# Patient Record
Sex: Male | Born: 1940 | Hispanic: No | Marital: Married | Smoking: Never smoker
Health system: Southern US, Community
[De-identification: ages and names within clinical notes are randomized; demographics above are authoritative.]

## PROBLEM LIST (undated history)

## (undated) DIAGNOSIS — C189 Malignant neoplasm of colon, unspecified: Secondary | ICD-10-CM

## (undated) HISTORY — PX: CHOLECYSTECTOMY: SHX55

## (undated) HISTORY — PX: COLON RESECTION: SHX5231

---

## 2017-05-18 ENCOUNTER — Emergency Department (HOSPITAL_BASED_OUTPATIENT_CLINIC_OR_DEPARTMENT_OTHER)
Admission: EM | Admit: 2017-05-18 | Discharge: 2017-05-18 | Disposition: A | Discharge status: Home / Self Care | Payer: Self-pay | Attending: Emergency Medicine | Admitting: Emergency Medicine

## 2017-05-18 ENCOUNTER — Emergency Department (HOSPITAL_BASED_OUTPATIENT_CLINIC_OR_DEPARTMENT_OTHER): Payer: Self-pay

## 2017-05-18 ENCOUNTER — Encounter (HOSPITAL_BASED_OUTPATIENT_CLINIC_OR_DEPARTMENT_OTHER): Payer: Self-pay | Admitting: *Deleted

## 2017-05-18 DIAGNOSIS — Z85038 Personal history of other malignant neoplasm of large intestine: Secondary | ICD-10-CM | POA: Insufficient documentation

## 2017-05-18 DIAGNOSIS — J189 Pneumonia, unspecified organism: Secondary | ICD-10-CM

## 2017-05-18 DIAGNOSIS — J181 Lobar pneumonia, unspecified organism: Secondary | ICD-10-CM | POA: Insufficient documentation

## 2017-05-18 DIAGNOSIS — N179 Acute kidney failure, unspecified: Secondary | ICD-10-CM | POA: Insufficient documentation

## 2017-05-18 DIAGNOSIS — R05 Cough: Secondary | ICD-10-CM

## 2017-05-18 DIAGNOSIS — R059 Cough, unspecified: Secondary | ICD-10-CM

## 2017-05-18 HISTORY — DX: Malignant neoplasm of colon, unspecified: C18.9

## 2017-05-18 LAB — COMPREHENSIVE METABOLIC PANEL
ALT: 51 U/L (ref 17–63)
AST: 56 U/L — AB (ref 15–41)
Albumin: 3.9 g/dL (ref 3.5–5.0)
Alkaline Phosphatase: 161 U/L — ABNORMAL HIGH (ref 38–126)
Anion gap: 13 (ref 5–15)
BUN: 53 mg/dL — ABNORMAL HIGH (ref 6–20)
CHLORIDE: 103 mmol/L (ref 101–111)
CO2: 13 mmol/L — AB (ref 22–32)
CREATININE: 1.85 mg/dL — AB (ref 0.61–1.24)
Calcium: 9 mg/dL (ref 8.9–10.3)
GFR calc non Af Amer: 34 mL/min — ABNORMAL LOW (ref >60)
GFR, EST AFRICAN AMERICAN: 39 mL/min — AB (ref >60)
Glucose, Bld: 111 mg/dL — ABNORMAL HIGH (ref 65–99)
POTASSIUM: 4.9 mmol/L (ref 3.5–5.1)
SODIUM: 129 mmol/L — AB (ref 135–145)
Total Bilirubin: 0.6 mg/dL (ref 0.3–1.2)
Total Protein: 8.7 g/dL — ABNORMAL HIGH (ref 6.5–8.1)

## 2017-05-18 LAB — CBC WITH DIFFERENTIAL/PLATELET
BASOS ABS: 0 10*3/uL (ref 0.0–0.1)
Basophils Relative: 0 %
EOS ABS: 0 10*3/uL (ref 0.0–0.7)
EOS PCT: 0 %
HCT: 45.5 % (ref 39.0–52.0)
Hemoglobin: 16 g/dL (ref 13.0–17.0)
Lymphocytes Relative: 16 %
Lymphs Abs: 1.5 10*3/uL (ref 0.7–4.0)
MCH: 33.4 pg (ref 26.0–34.0)
MCHC: 35.2 g/dL (ref 30.0–36.0)
MCV: 95 fL (ref 78.0–100.0)
Monocytes Absolute: 1 10*3/uL (ref 0.1–1.0)
Monocytes Relative: 12 %
Neutro Abs: 6.5 10*3/uL (ref 1.7–7.7)
Neutrophils Relative %: 72 %
PLATELETS: 193 10*3/uL (ref 150–400)
RBC: 4.79 MIL/uL (ref 4.22–5.81)
RDW: 13.7 % (ref 11.5–15.5)
WBC: 9.1 10*3/uL (ref 4.0–10.5)

## 2017-05-18 MED ORDER — SODIUM CHLORIDE 0.9 % IV BOLUS (SEPSIS)
1000.0000 mL | Freq: Once | INTRAVENOUS | Status: AC
  Administered 2017-05-18: 1000 mL via INTRAVENOUS

## 2017-05-18 MED ORDER — DOXYCYCLINE HYCLATE 100 MG PO TABS
100.0000 mg | ORAL_TABLET | Freq: Once | ORAL | Status: AC
  Administered 2017-05-18: 100 mg via ORAL
  Filled 2017-05-18: qty 1

## 2017-05-18 MED ORDER — DOXYCYCLINE HYCLATE 100 MG PO CAPS: 100.0000 mg | ORAL_CAPSULE | Freq: Two times a day (BID) | ORAL | 0 refills | Status: AC

## 2017-05-18 NOTE — ED Notes (Signed)
Pt requested more cheese, crackers, and juice. Given to patient.

## 2017-05-18 NOTE — ED Provider Notes (Signed)
[facility] EMERGENCY DEPARTMENT Provider Note  CSN: [id number] Arrival date & time: 05/18/17 1400  Chief Complaint(s) URI  HPI Thomas Walker is a 76 y.o. male   The history is provided by the patient.  Influenza  Presenting symptoms: cough, diarrhea, fatigue, myalgias, rhinorrhea and sore throat   Presenting symptoms: no vomiting   Severity:  Moderate Onset quality:  Gradual Duration:  6 days Progression:  Waxing and waning Chronicity:  New Relieved by:  Nothing Worsened by:  Nothing Associated symptoms: chills, decreased appetite, decreased physical activity and nasal congestion   Risk factors: being elderly   Risk factors: no sick contacts     Past Medical History Past Medical History:  Diagnosis Date  . Colon cancer ([place])    There are no active problems to display for this patient.  Home Medication(s) Prior to Admission medications   Medication Sig Start Date End Date Taking? Authorizing Provider  doxycycline (VIBRAMYCIN) 100 MG capsule Take 1 capsule (100 mg total) by mouth 2 (two) times daily. 05/18/17 05/25/17  Fatima Blank, MD                                                                                                                                    Past Surgical History Past Surgical History:  Procedure Laterality Date  . CHOLECYSTECTOMY    . COLON RESECTION     Family History No family history on file.  Social History Social History  Substance Use Topics  . Smoking status: Never Smoker  . Smokeless tobacco: Not on file  . Alcohol use No   Allergies Patient has no known allergies.  Review of Systems Review of Systems  Constitutional: Positive for chills, decreased appetite and fatigue.  HENT: Positive for congestion, rhinorrhea and sore throat.   Respiratory: Positive for cough.   Gastrointestinal: Positive for diarrhea. Negative for vomiting.  Musculoskeletal: Positive for myalgias.   All other systems are  reviewed and are negative for acute change except as noted in the HPI  Physical Exam Vital Signs  I have reviewed the triage vital signs BP 111/62 (BP Location: Left Arm)   Pulse (!) 104   Temp 97.7 F (36.5 C) (Oral)   Resp 18   Ht 5\' 8"  (1.727 m)   Wt 62.6 kg (138 lb)   SpO2 99%   BMI 20.98 kg/m   Physical Exam  Constitutional: He is oriented to person, place, and time. He appears well-developed and well-nourished. He appears ill. No distress.  HENT:  Head: Normocephalic and atraumatic.  Nose: Nose normal.  Eyes: Pupils are equal, round, and reactive to light. Conjunctivae and EOM are normal. Right eye exhibits no discharge. Left eye exhibits no discharge. No scleral icterus.  Neck: Normal range of motion. Neck supple.  Cardiovascular: Normal rate and regular rhythm.  Exam reveals no gallop and no friction rub.   No murmur heard. Pulmonary/Chest: Effort normal. No  stridor. No respiratory distress. He has rales in the left lower field.  Abdominal: Soft. He exhibits no distension. There is no tenderness.  Musculoskeletal: He exhibits no edema or tenderness.  Neurological: He is alert and oriented to person, place, and time.  Skin: Skin is warm and dry. No rash noted. He is not diaphoretic. No erythema.  Psychiatric: He has a normal mood and affect.  Vitals reviewed.   ED Results and Treatments Labs (all labs ordered are listed, but only abnormal results are displayed) Labs Reviewed  COMPREHENSIVE METABOLIC PANEL - Abnormal; Notable for the following:       Result Value   Sodium 129 (*)    CO2 13 (*)    Glucose, Bld 111 (*)    BUN 53 (*)    Creatinine, Ser 1.85 (*)    Total Protein 8.7 (*)    AST 56 (*)    Alkaline Phosphatase 161 (*)    GFR calc non Af Amer 34 (*)    GFR calc Af Amer 39 (*)    All other components within normal limits  CBC WITH DIFFERENTIAL/PLATELET                                                                                                                          EKG  EKG Interpretation  Date/Time:    Ventricular Rate:    PR Interval:    QRS Duration:   QT Interval:    QTC Calculation:   R Axis:     Text Interpretation:        Radiology Dg Chest 2 View  Result Date: 05/18/2017 CLINICAL DATA:  URI symptoms for 6 days EXAM: CHEST  2 VIEW COMPARISON:  None. FINDINGS: Hyperinflation. Mild central bronchitic changes. Mild opacity overlying the cardiac silhouette on lateral view. Normal heart size. No pneumothorax. IMPRESSION: Small focus of consolidation at the anterior lung base may reflect atelectasis or a small infiltrate. Radiographic follow-up is recommended. Hyperinflation with minimal bronchitic changes. Electronically Signed   By: Donavan Foil M.D.   On: 05/18/2017 15:14   Pertinent labs & imaging results that were available during my care of the patient were reviewed by me and considered in my medical decision making (see chart for details).  Medications Ordered in ED Medications  sodium chloride 0.9 % bolus 1,000 mL (1,000 mLs Intravenous New Bag/Given 05/18/17 1457)  sodium chloride 0.9 % bolus 1,000 mL (1,000 mLs Intravenous New Bag/Given 05/18/17 1543)  doxycycline (VIBRA-TABS) tablet 100 mg (100 mg Oral Given 05/18/17 1543)  Procedures Procedures  (including critical care time)  Medical Decision Making / ED Course I have reviewed the nursing notes for this encounter and the patient's prior records (if available in EHR or on provided paperwork).    76 y.o. male presents with flu-like symptoms for 6 days. decreased oral hydration. Rest of history as above.  Patient appears well. No signs of toxicity, patient is interactive. No hypoxia, tachypnea or other signs of respiratory distress. No sign of clinical dehydration. Lung exam with LLL rales. Rest of exam as above.  CXR  concerning for PNA.   Labs without leukocytosis.  CMP with mild hyponatremia, also with evidence of acute renal insufficiency likely secondary to dehydration given the lack of PO intake.  Patient given IV fluid hydration and first dose of doxycycline in the emergency department.  No evidence suggestive of pharyngitis, AOM, or meningitis.  Offered admission however patient declined.  Will give patient additional IV fluid bolus and discharged home with strict return precautions.  Patient care turned over to Dr Sherry Ruffing at 1600. Patient case and results discussed in detail; please see their note for further ED managment.    Final Clinical Impression(s) / ED Diagnoses Final diagnoses:  Cough  Community acquired pneumonia of left lower lobe of lung ([place])  AKI (acute kidney injury) ([place])      This chart was dictated using voice recognition software.  Despite best efforts to proofread,  errors can occur which can change the documentation meaning.   Fatima Blank, MD 05/18/17 [phone]

## 2017-05-18 NOTE — ED Notes (Signed)
Pt discharged to home with family. NAD steady gait.

## 2017-05-18 NOTE — ED Notes (Signed)
Cheese, crackers and juice given to patient.

## 2017-05-18 NOTE — ED Triage Notes (Signed)
Pt c/o URi symptoms x 6 days , sent here from [facility] , flu test negative

## 2018-11-06 IMAGING — CR DG CHEST 2V
2 series · 2 of 2 positions shown · non-contrast
Comparison: None.

CLINICAL DATA: URI symptoms for 6 days

EXAM:
CHEST  2 VIEW

[w chest pa]
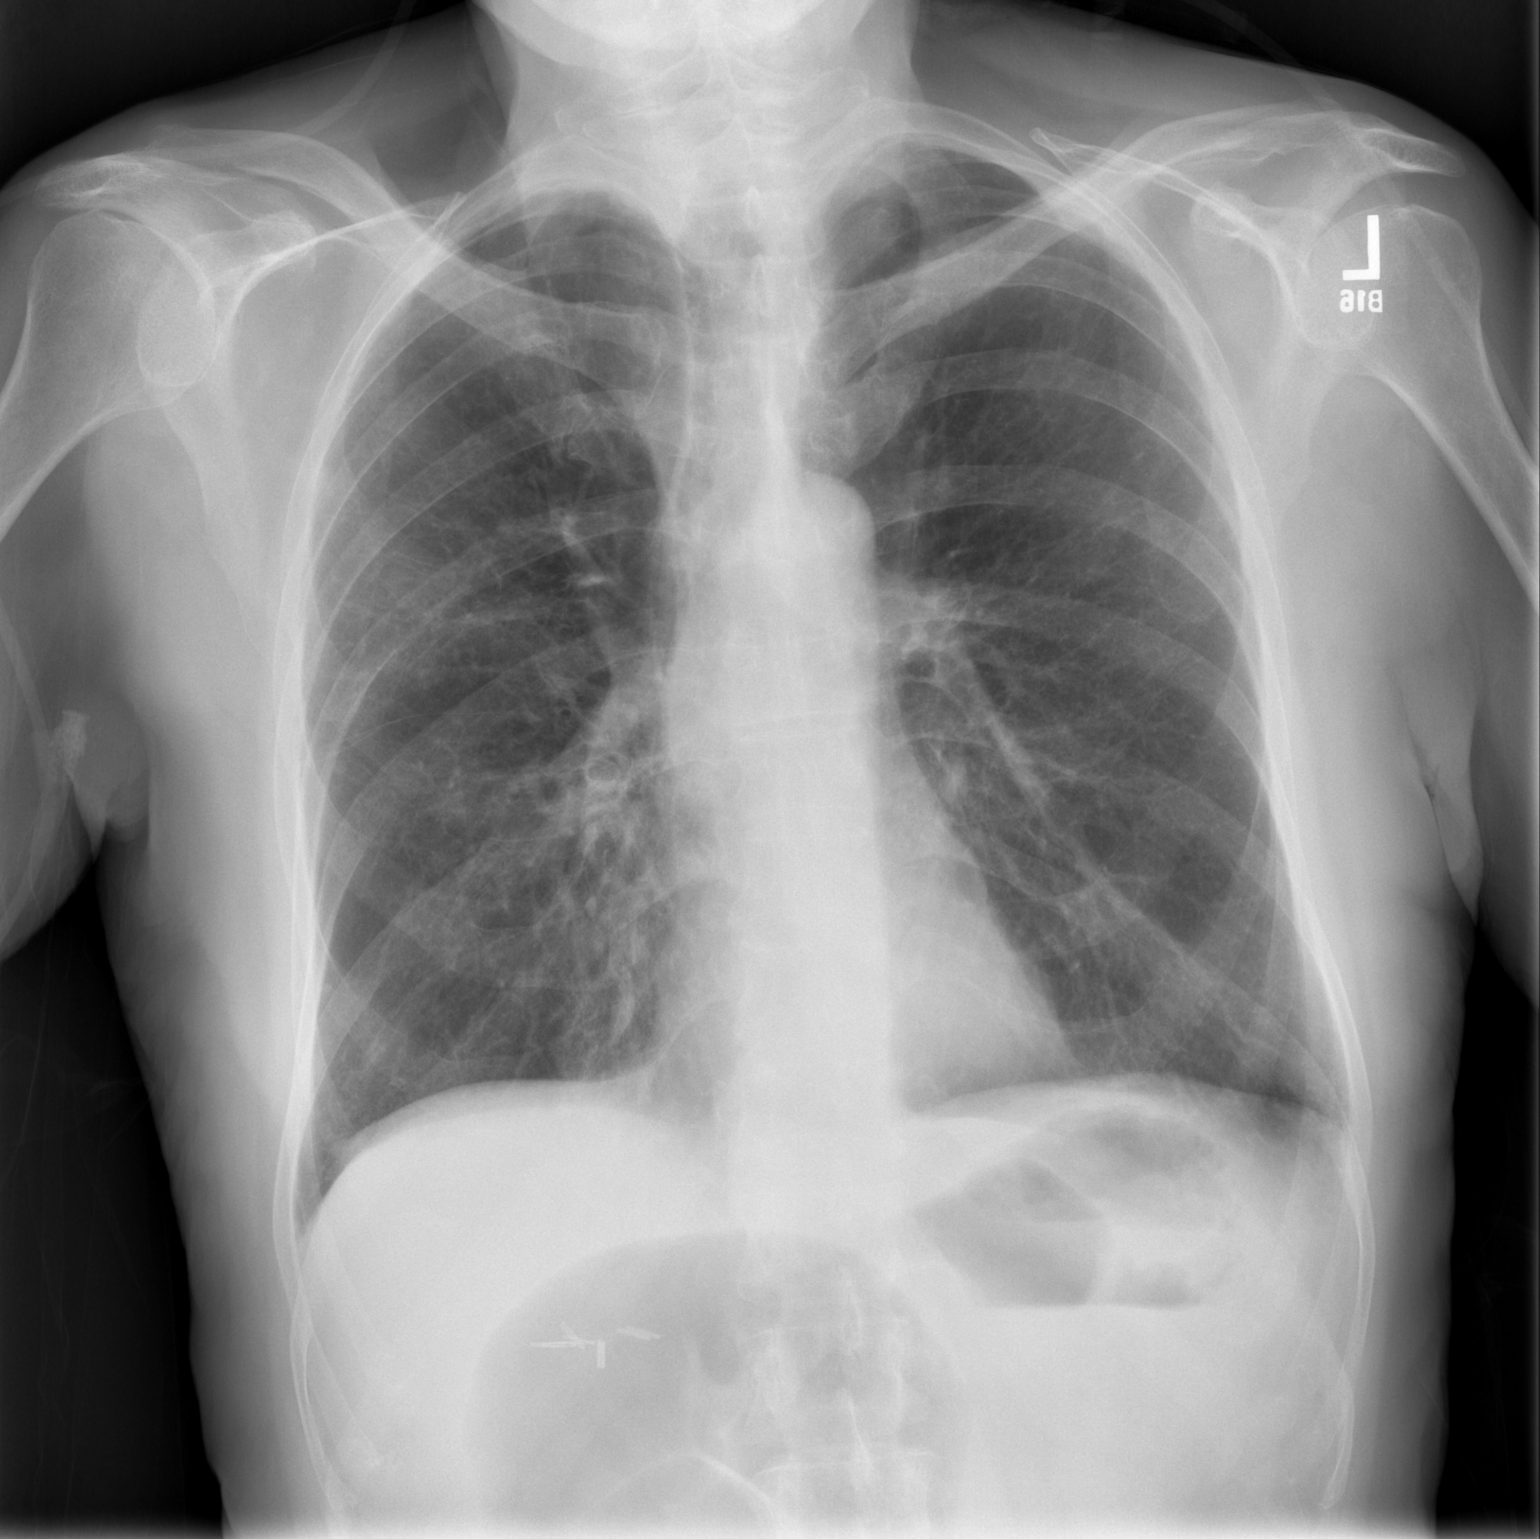

[w chest lat]
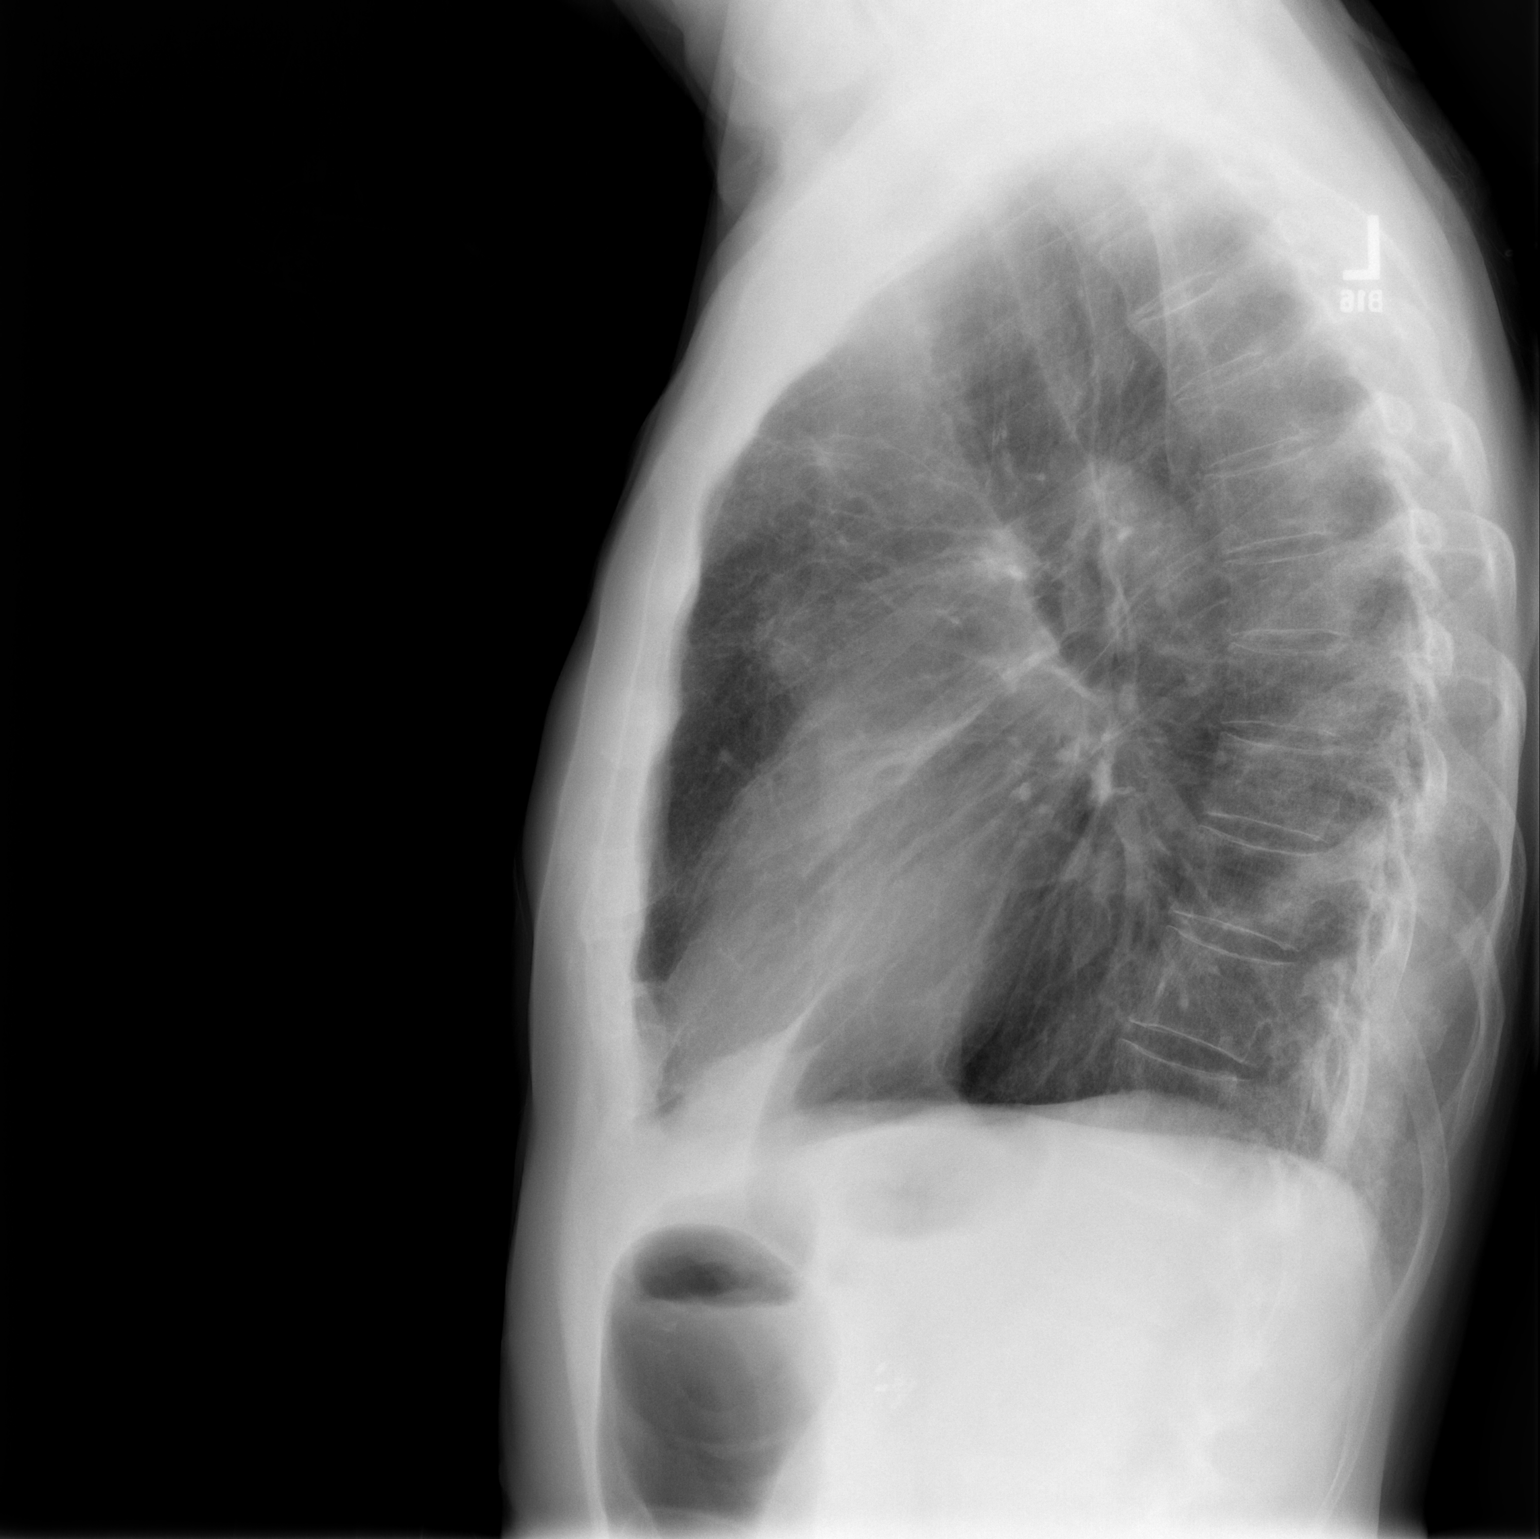

[2 of 2 positions shown; findings below may reference images not displayed]

FINDINGS: Hyperinflation. Mild central bronchitic changes. Mild opacity
overlying the cardiac silhouette on lateral view. Normal heart size.
No pneumothorax.
IMPRESSION: Small focus of consolidation at the anterior lung base may reflect
atelectasis or a small infiltrate. Radiographic follow-up is
recommended. Hyperinflation with minimal bronchitic changes.
